# Patient Record
Sex: Female | Born: 1980 | Race: Black or African American | Hispanic: No | Marital: Single
Health system: Southern US, Community
[De-identification: ages and names within clinical notes are randomized; demographics above are authoritative.]

---

## 2010-11-16 ENCOUNTER — Emergency Department: Payer: Self-pay | Admitting: Emergency Medicine

## 2011-07-09 ENCOUNTER — Emergency Department: Payer: Self-pay | Admitting: *Deleted

## 2011-07-09 LAB — COMPREHENSIVE METABOLIC PANEL
Anion Gap: 7 (ref 7–16)
BUN: 13 mg/dL (ref 7–18)
Bilirubin,Total: 0.7 mg/dL (ref 0.2–1.0)
Calcium, Total: 9.1 mg/dL (ref 8.5–10.1)
Chloride: 105 mmol/L (ref 98–107)
Co2: 25 mmol/L (ref 21–32)
Creatinine: 0.82 mg/dL (ref 0.60–1.30)
EGFR (African American): >60
EGFR (Non-African Amer.): >60
Potassium: 3.5 mmol/L (ref 3.5–5.1)
SGOT(AST): 23 U/L (ref 15–37)
SGPT (ALT): 14 U/L

## 2011-07-09 LAB — CBC
HCT: 36.3 % (ref 35.0–47.0)
HGB: 12.2 g/dL (ref 12.0–16.0)
MCH: 29.6 pg (ref 26.0–34.0)
MCHC: 33.6 g/dL (ref 32.0–36.0)
MCV: 88 fL (ref 80–100)
Platelet: 273 10*3/uL (ref 150–440)
RBC: 4.11 10*6/uL (ref 3.80–5.20)

## 2011-07-09 LAB — URINALYSIS, COMPLETE
Bacteria: NONE SEEN
Glucose,UR: NEGATIVE mg/dL (ref 0–75)
Ketone: NEGATIVE
Leukocyte Esterase: NEGATIVE
Ph: 5 (ref 4.5–8.0)
Protein: NEGATIVE
RBC,UR: 1 /HPF (ref 0–5)
WBC UR: <1 /HPF (ref 0–5)

## 2011-07-09 LAB — HCG, QUANTITATIVE, PREGNANCY: Beta Hcg, Quant.: 7690 m[IU]/mL — ABNORMAL HIGH

## 2011-07-16 ENCOUNTER — Emergency Department: Payer: Self-pay | Admitting: *Deleted

## 2011-07-16 LAB — URINALYSIS, COMPLETE
Bilirubin,UR: NEGATIVE
Blood: NEGATIVE
Glucose,UR: NEGATIVE mg/dL (ref 0–75)
Ph: 7 (ref 4.5–8.0)
RBC,UR: 2 /HPF (ref 0–5)
Specific Gravity: 1.024 (ref 1.003–1.030)
WBC UR: 2 /HPF (ref 0–5)

## 2011-07-16 LAB — HCG, QUANTITATIVE, PREGNANCY: Beta Hcg, Quant.: 32323 m[IU]/mL — ABNORMAL HIGH

## 2011-07-16 LAB — COMPREHENSIVE METABOLIC PANEL
Albumin: 3.8 g/dL (ref 3.4–5.0)
Anion Gap: 8 (ref 7–16)
BUN: 11 mg/dL (ref 7–18)
Chloride: 102 mmol/L (ref 98–107)
Co2: 27 mmol/L (ref 21–32)
EGFR (African American): >60
EGFR (Non-African Amer.): >60
Osmolality: 272 (ref 275–301)
Potassium: 3.7 mmol/L (ref 3.5–5.1)
SGOT(AST): 13 U/L — ABNORMAL LOW (ref 15–37)
SGPT (ALT): 13 U/L
Sodium: 137 mmol/L (ref 136–145)

## 2011-07-16 LAB — CBC
HGB: 11.7 g/dL — ABNORMAL LOW (ref 12.0–16.0)
MCH: 29.8 pg (ref 26.0–34.0)
MCHC: 33.8 g/dL (ref 32.0–36.0)
Platelet: 262 10*3/uL (ref 150–440)
RBC: 3.93 10*6/uL (ref 3.80–5.20)
RDW: 12.2 % (ref 11.5–14.5)

## 2011-07-16 LAB — WET PREP, GENITAL

## 2012-11-22 ENCOUNTER — Observation Stay: Payer: Self-pay | Admitting: Obstetrics and Gynecology

## 2012-11-22 LAB — CBC WITH DIFFERENTIAL/PLATELET
Basophil %: 0.4 %
HGB: 12.6 g/dL (ref 12.0–16.0)
Lymphocyte %: 41.5 %
MCH: 30.3 pg (ref 26.0–34.0)
Monocyte #: 0.5 x10 3/mm (ref 0.2–0.9)
Neutrophil #: 4 10*3/uL (ref 1.4–6.5)
Platelet: 282 10*3/uL (ref 150–440)
WBC: 7.9 10*3/uL (ref 3.6–11.0)

## 2012-11-22 LAB — COMPREHENSIVE METABOLIC PANEL
Alkaline Phosphatase: 47 U/L — ABNORMAL LOW (ref 50–136)
Anion Gap: 3 — ABNORMAL LOW (ref 7–16)
BUN: 12 mg/dL (ref 7–18)
Bilirubin,Total: 0.7 mg/dL (ref 0.2–1.0)
Calcium, Total: 9.1 mg/dL (ref 8.5–10.1)
Co2: 27 mmol/L (ref 21–32)
EGFR (African American): >60
Osmolality: 269 (ref 275–301)
Potassium: 3.8 mmol/L (ref 3.5–5.1)
SGOT(AST): 21 U/L (ref 15–37)
SGPT (ALT): 15 U/L (ref 12–78)
Sodium: 135 mmol/L — ABNORMAL LOW (ref 136–145)

## 2012-11-22 LAB — URINALYSIS, COMPLETE
Bacteria: NONE SEEN
Bilirubin,UR: NEGATIVE
Glucose,UR: NEGATIVE mg/dL (ref 0–75)
Ketone: NEGATIVE
Leukocyte Esterase: NEGATIVE
Nitrite: NEGATIVE
Ph: 6 (ref 4.5–8.0)
Protein: NEGATIVE
Specific Gravity: 1.023 (ref 1.003–1.030)
WBC UR: <1 /HPF (ref 0–5)

## 2012-11-22 LAB — HCG, QUANTITATIVE, PREGNANCY: Beta Hcg, Quant.: 5224 m[IU]/mL — ABNORMAL HIGH

## 2012-12-11 IMAGING — US US PELV - US TRANSVAGINAL
1 series · 17 of 25 positions shown · non-contrast
Comparison: none

REASON FOR EXAM: bilateral pelvic pain
COMMENTS:

PROCEDURE:     US  - US PELVIS EXAM W/TRANSVAGINAL  - November 17, 2010  [DATE]
RESULT:     Comparison: None
INDICATION: Bilateral pelvic pain
TECHNIQUE: Multiple transabdominal gray-scale images and endovaginal
gray-scale images with doppler images of the pelvis performed.

[Series 1: us pelv - us transvaginal · 17 of 86 slices shown]
[im 1/86]
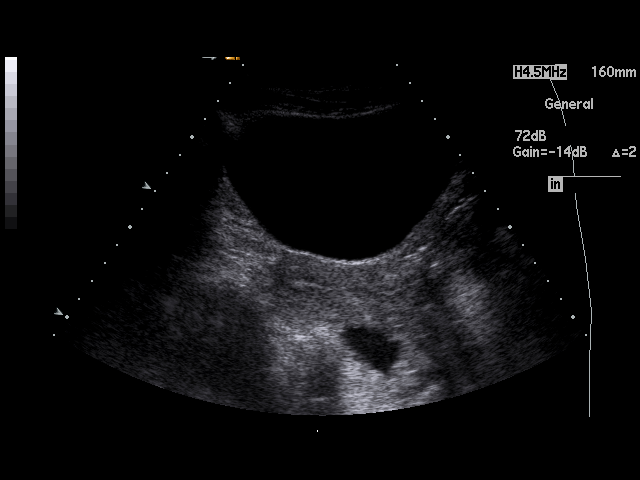
[im 8/86]
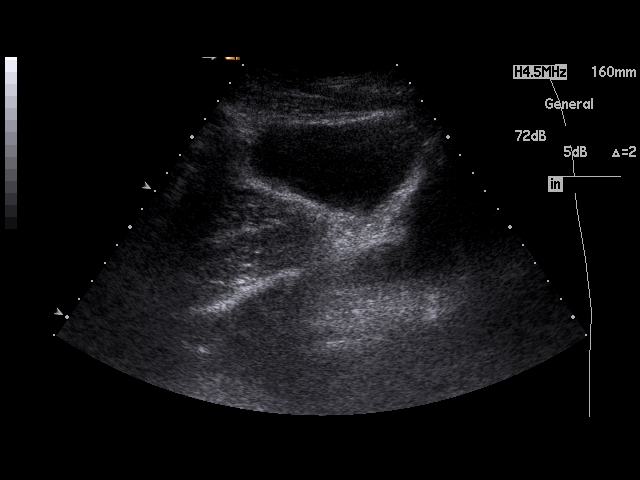
[im 11/86]
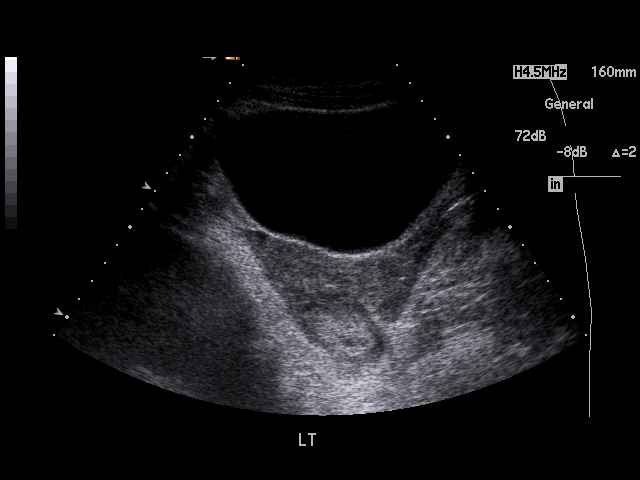
[im 18/86]
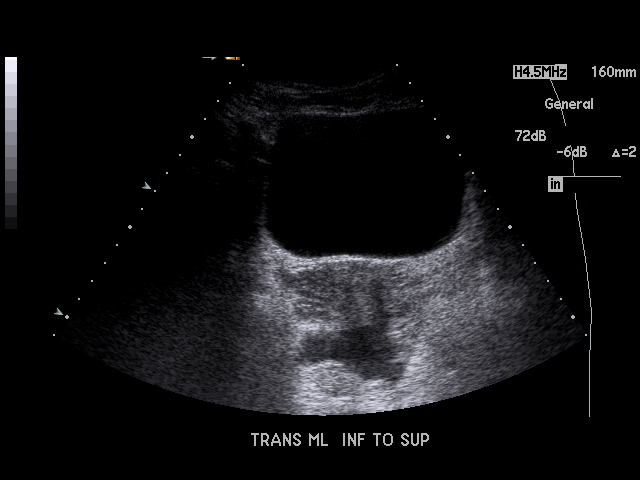
[im 22/86]
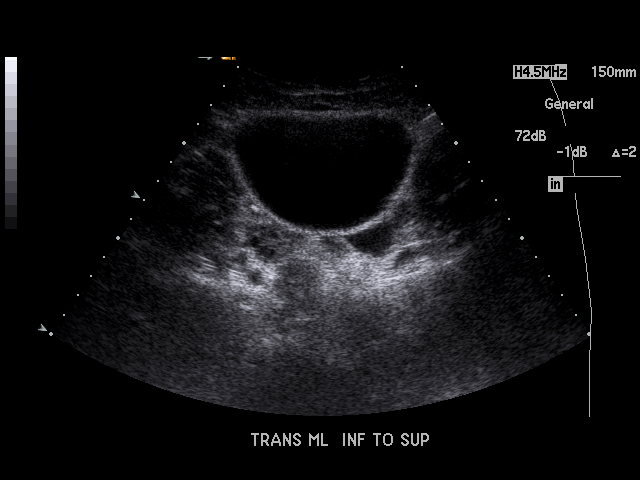
[im 29/86]
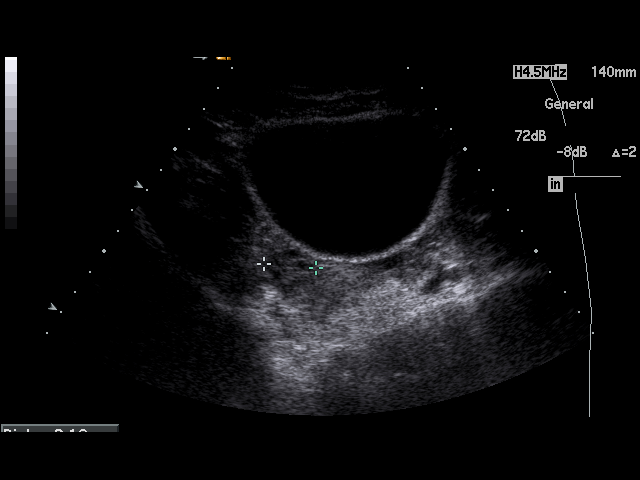
[im 32/86]
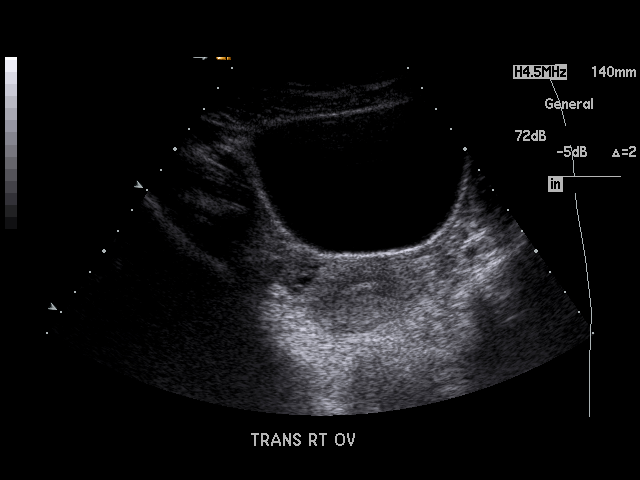
[im 39/86]
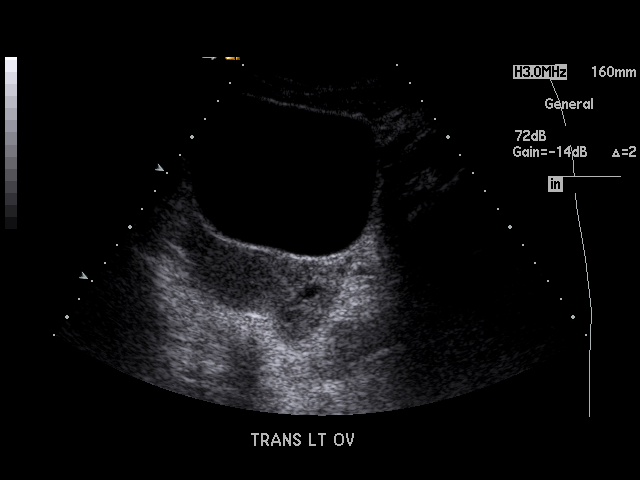
[im 43/86]
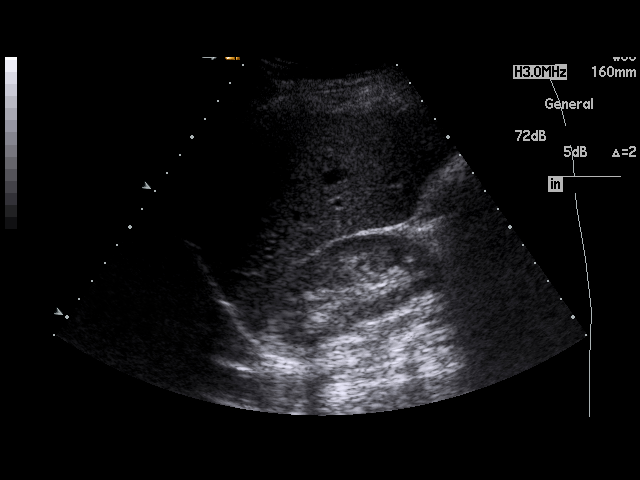
[im 47/86]
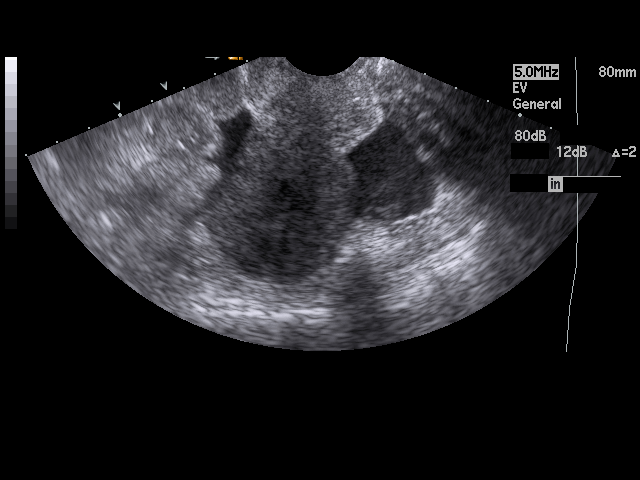
[im 54/86]
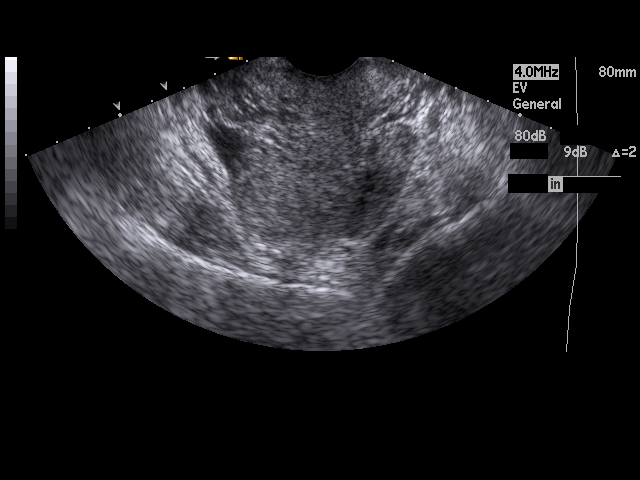
[im 57/86]
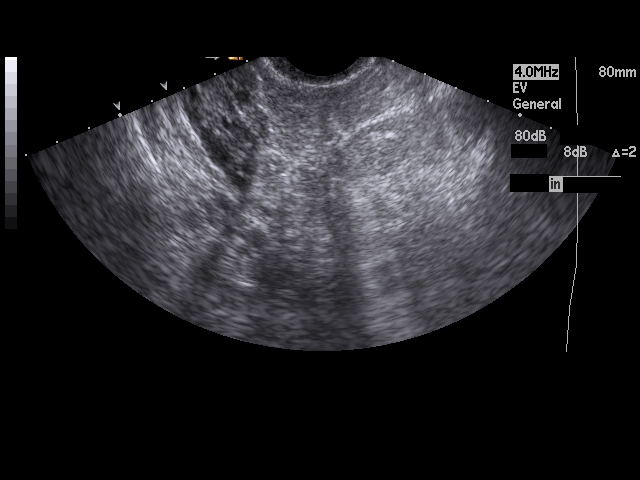
[im 64/86]
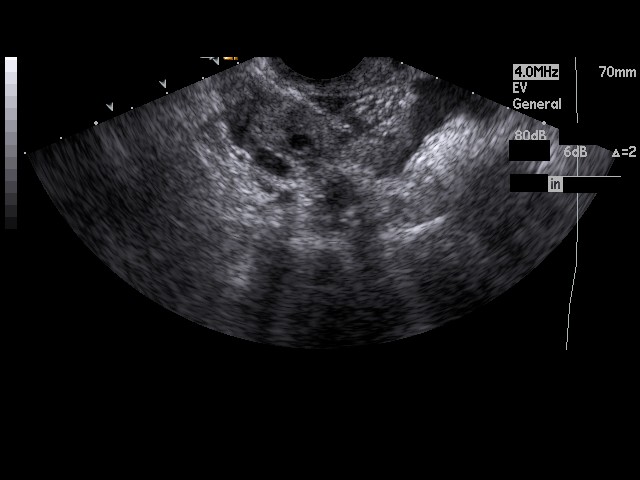
[im 68/86]
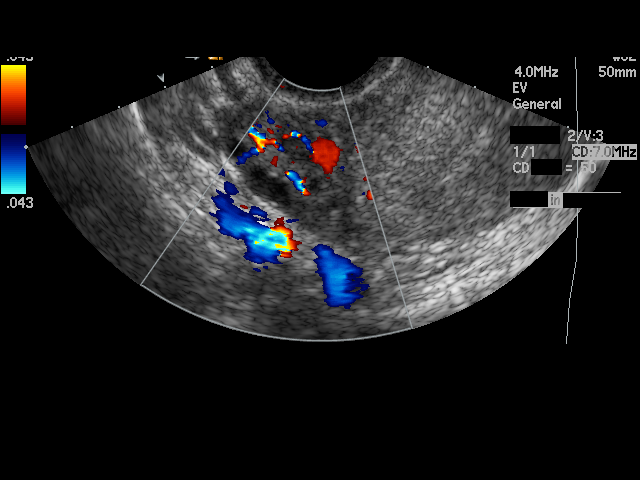
[im 75/86]
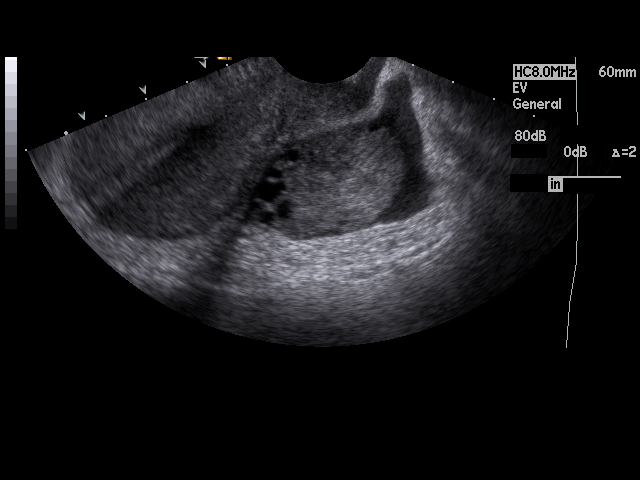
[im 78/86]
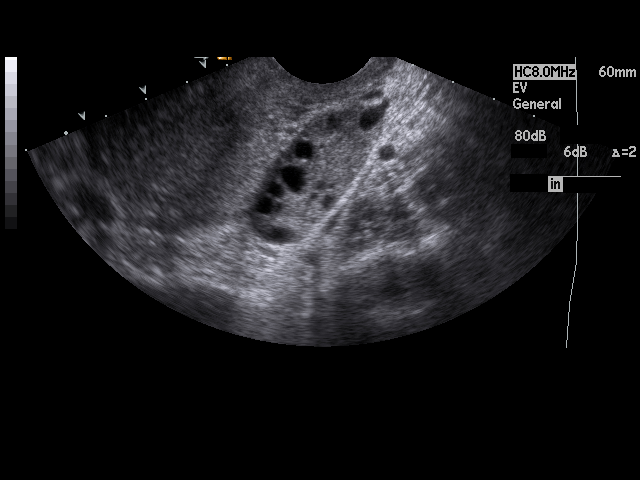
[im 86/86]
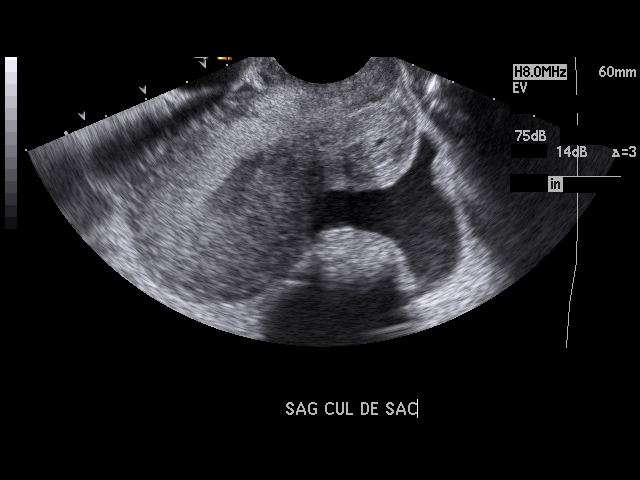

[17 of 25 positions shown; findings below may reference images not displayed]

FINDINGS: The uterus is normal in echotexture measuring 10.3 x 4.1 x 3.3 cm , with
transabdominal ultrasound. The endometrial stripe is uniform and homogeneous
measuring 3.6 mm.  There are no abnormal solid or cystic myometrial mass
lesions noted.

The right ovary measures 3.7 x 2.3 x 1.5 cm.  The left ovary measures 5.1 x
2.7 x 2.9 cm.  There is a heterogeneously hypoechoic 3.2 x 2.1 x 2.2 cm left
ovarian mass with no internal Doppler flow.  Normal arterial and venous
Doppler waveforms are demonstrated bilaterally.

There is a small-moderate amount of fluid in the cul-de-sac with the debris
within it.
IMPRESSION: 1. There is a heterogeneously hypoechoic 3.2 x 2.1 x 2.2 cm left ovarian
mass with no internal Doppler flow.  This may represent a hemorrhagic cyst
versus endometrioma. Recommend followup pelvic ultrasound in 4-6 weeks.

2. Nonspecific small-moderate amount of fluid in the cul-de-sac with the
debris within it.

## 2013-08-09 IMAGING — US US OB < 14 WEEKS - US OB TV
1 series · 14 of 28 positions shown · non-contrast
Comparison: none

REASON FOR EXAM: left pelvic pain with early gestation.  History of
rupture ovaian cyst
COMMENTS:   LMP: > one month ago

[Series 1: us ob < 14 weeks - us ob tv · 0.26mm/px · 82 acquisitions, 14 frames shown]
[im 4/82]
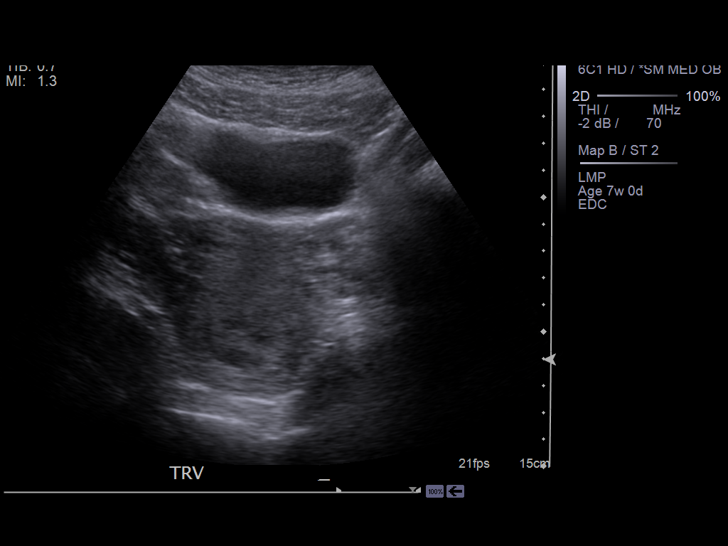
[im 10/82]
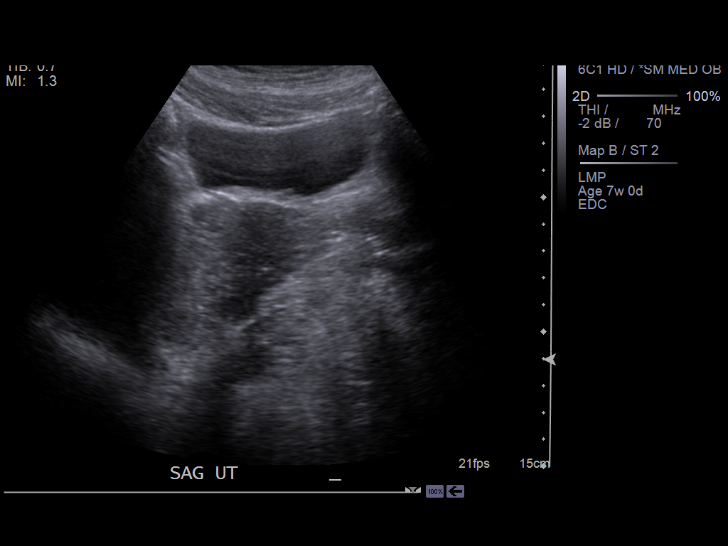
[im 16/82]
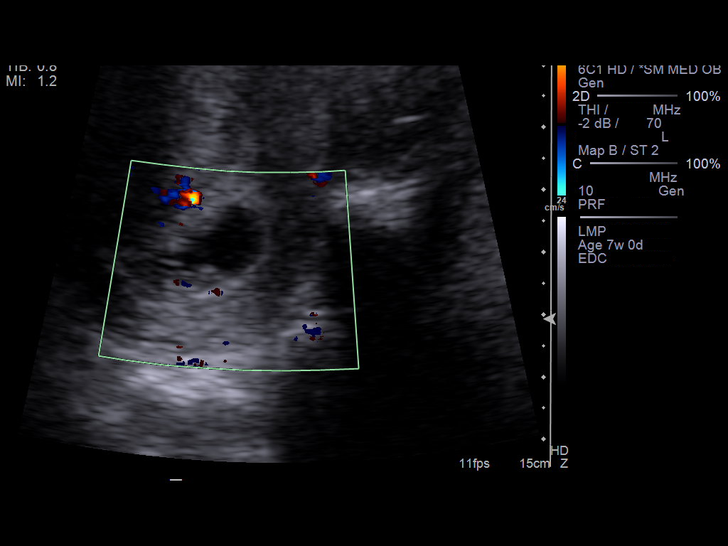
[im 22/82]
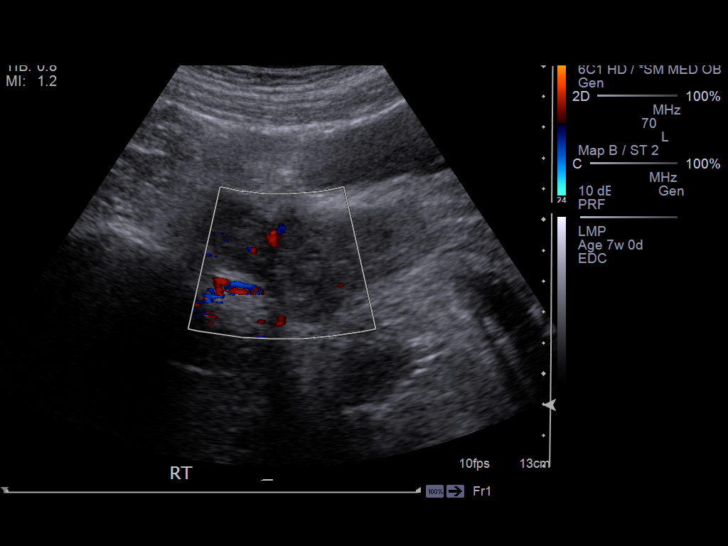
[im 28/82]
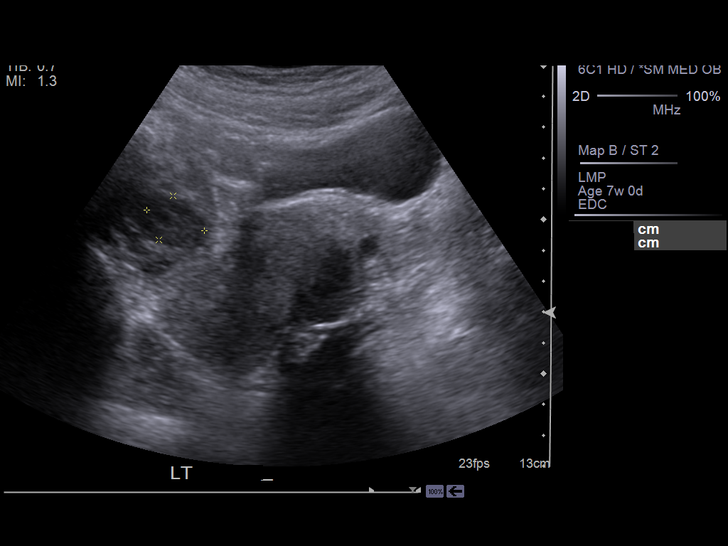
[im 34/82]
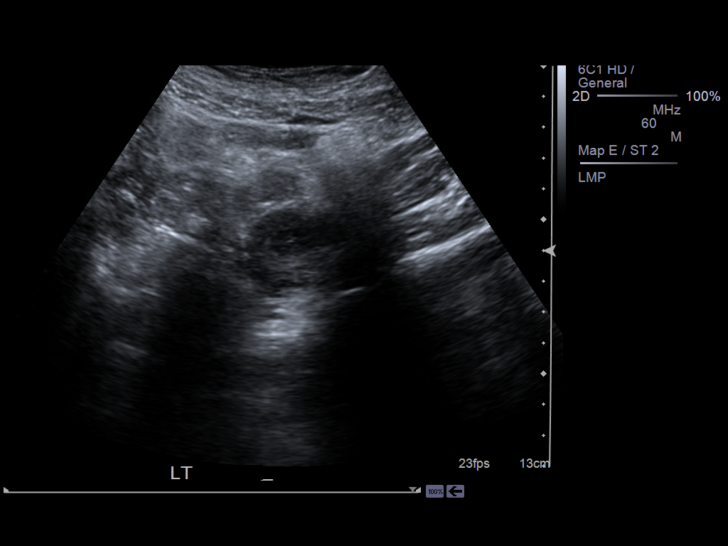
[im 40/82]
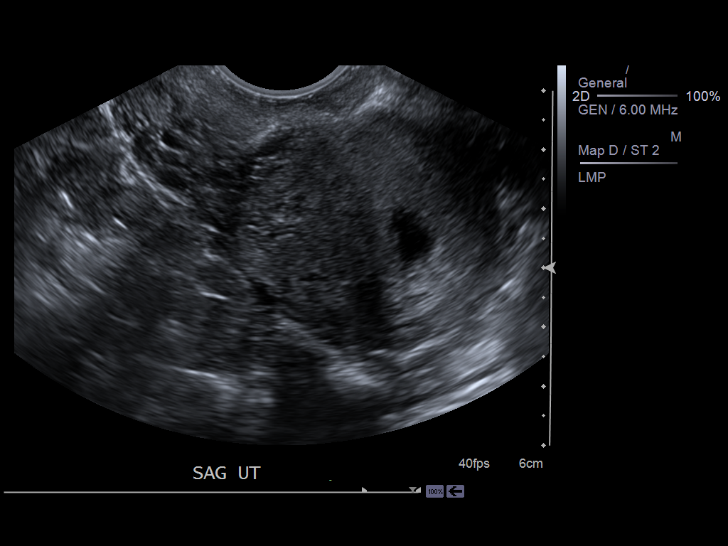
[im 46/82]
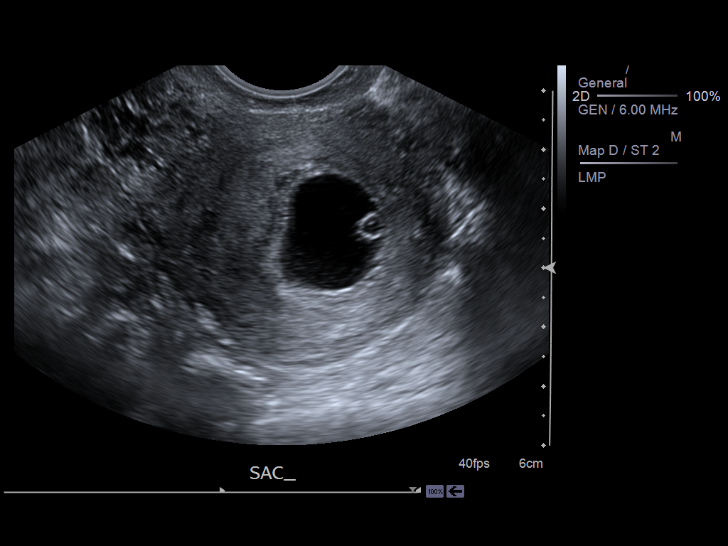
[im 52/82]
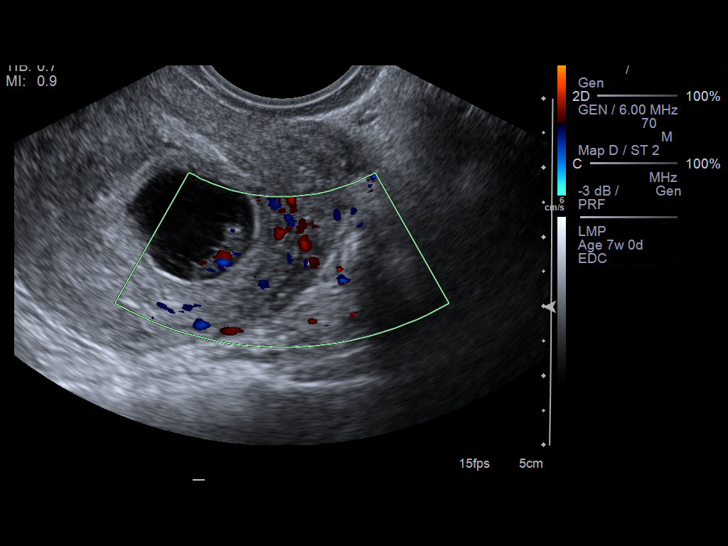
[im 58/82]
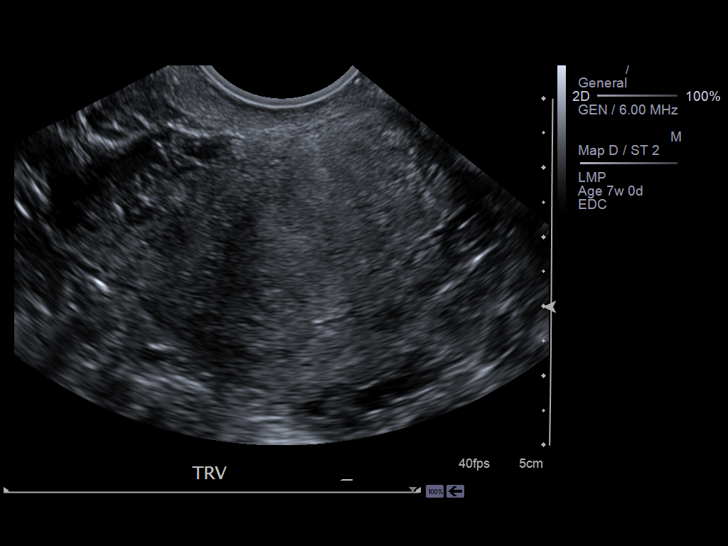
[im 64/82]
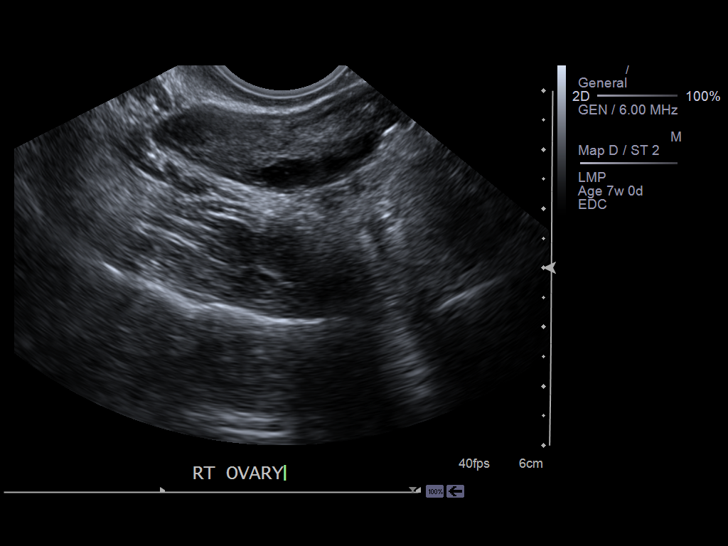
[im 70/82]
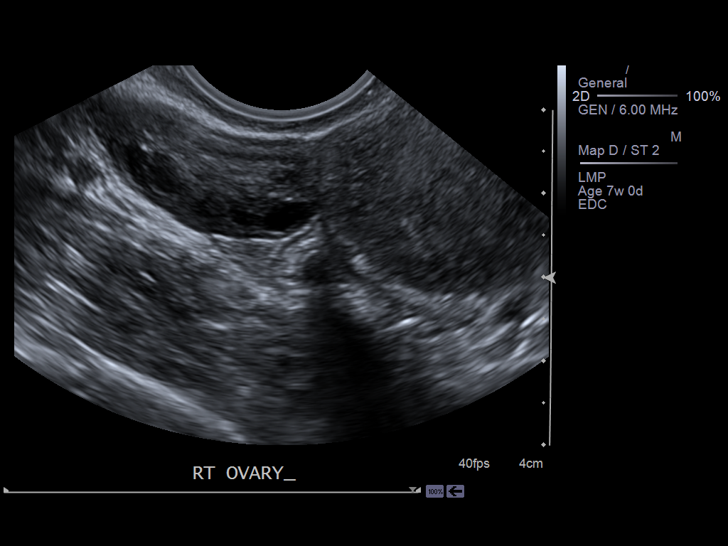
[im 76/82]
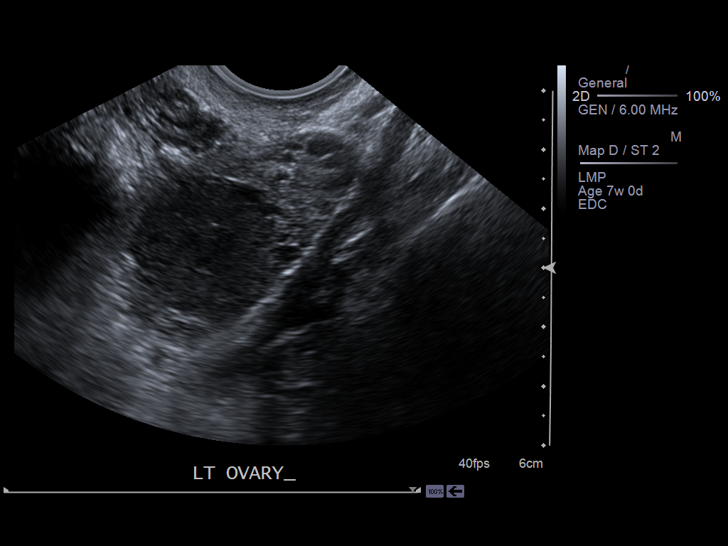
[im 82/82]
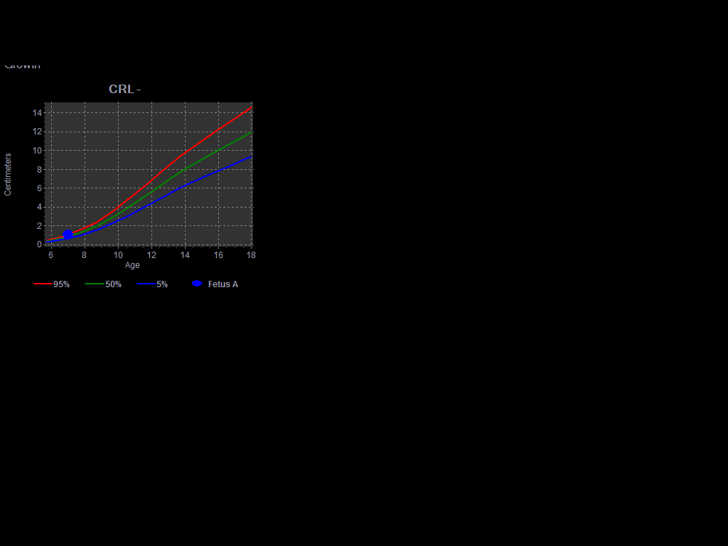

[14 of 28 positions shown; findings below may reference images not displayed]

PROCEDURE:     US  - US OB LESS THAN 14 WEEKS/W TRANS  - July 16, 2011  [DATE]

RESULT:     There is a gravid uterus present. A fetal pole and yolk sac are
demonstrated. A fetal cardiac rate of 118 beats per minute was demonstrated.
The maternal right ovary measures 3.3 x 1.8 cm in dimension and is normal in
echotexture. The left ovary measures 3.5 by 2 cm and contains a hypoechoic
structure measuring 2.5 cm in diameter.
IMPRESSION: 1. There is a viable IUP with a crown-rump length of 1.03 cm corresponding
to 7 week 1 day gestation. The estimated date of confinement is 02 March, 2012. This is in reasonable agreement with clinical dating. There is no
evidence of subchorionic hemorrhage.
2. On the left there is a hypoechoic focus in the ovary which may reflect a
hemorrhagic cyst or a corpus luteum cyst. The appearance does not suggest
that of an ectopic pregnancy. Serial followup beta-hCG determinations and
ultrasound exams would be useful especially if the patient's symptoms
persi[REDACTED]

## 2014-01-08 ENCOUNTER — Observation Stay: Payer: Self-pay | Admitting: Obstetrics & Gynecology

## 2014-01-08 LAB — URINALYSIS, COMPLETE
BILIRUBIN, UR: NEGATIVE
Bacteria: NONE SEEN
Blood: NEGATIVE
GLUCOSE, UR: NEGATIVE mg/dL (ref 0–75)
KETONE: NEGATIVE
Leukocyte Esterase: NEGATIVE
Nitrite: NEGATIVE
PH: 6 (ref 4.5–8.0)
Protein: 30
Specific Gravity: 1.018 (ref 1.003–1.030)

## 2014-04-28 NOTE — Op Note (Signed)
PATIENT NAME:  Debra Forbes, Debra Forbes MR#:  [id number] DATE OF BIRTH:  1980/04/28  DATE OF PROCEDURE:  11/29/2012  PREOPERATIVE DIAGNOSIS: Possible right ectopic pregnancy.   POSTOPERATIVE DIAGNOSIS: Right ruptured cornual ectopic.   PROCEDURE: Removal of right ectopic pregnancy, repair of uterine wall.   SURGEON: Elliot Gurneyarrie C Bryce Kimble, M.D.   ESTIMATED BLOOD LOSS: Approximately 200 mL on top of what was already in the belly of dark extravasated blood.   FINDINGS: Blown-out right cornua of the uterus with bleeding from small blood vessels. Normal ovaries bilaterally. Normal left tube. Normal-appearing uterus.   DESCRIPTION OF PROCEDURE: The patient was taken to the operating room and placed in supine position. After adequate general endotracheal anesthesia was instilled, the patient was prepped and draped in the usual sterile fashion. A side-opening speculum was placed in the patient's vagina, and the anterior lip of the cervix was grasped with a single-tooth tenaculum. The side-opening speculum was removed.   Attention was then turned to the umbilicus, which was injected with Marcaine. An incision was made infraumbilically. The Veress needle was placed. Hang drop test, fluid instillation test, and fluid aspiration test showed proper placement of the Veress needle. CO2 was turned on, and when CO2 was turned on, tympany was heard around the liver. CO2 was then placed on high flow. The Veress needle was removed. The Xcel trocar was then placed under direct visualization, and the patient was placed in Trendelenburg. Two other ports, an 11 placed on the left and a 5 placed on the right.   The pelvis was irrigated with copious amounts of warm normal saline, and the aforementioned findings were seen. Photographs were taken. The products of conception, placenta and the fetus, seen at the right cornual section, were removed, cut off with the Harmonic scalpel. The blood vessels that were bleeding were cauterized with  Kleppinger, and 2 figure-of-eight sutures were placed with the Endo Stitch of Vicryl suture. These were tightened down to tamponade the bleeding and to approximate the edges for healing.   The pelvis was then again copiously cleared with warm normal saline. The patient's trocars were removed. CO2 was allowed to escape. The incisions were closed with a UR-6 for the deep large incision, 4-0 Monocryl at the surface covered with Dermabond. Post surgery, the patient was given methotrexate to be sure that the entirety of the placenta had been eliminated from the cornual area. The tubal opening into the uterus was round and patent, and able to be probed.  The patient remained hemostatic and stable throughout the whole procedure. The patient had had a right salpingectomy for another previous ectopic. There was no scarring. There was no tissue left. The patient and her partner were told that she should not get pregnant again, as it would be very dangerous for her with risk of rupture of the cornual area, and that if she ever considered that she would need to [facility] a [facility] to get input as to the  safety and the ability to not only get pregnant but to carry a full-term pregnancy without problems.     ____________________________ Elliot Gurneyarrie C. Debra Nordmann, MD cck:cg D: 11/29/2012 00:25:00 ET T: 11/29/2012 05:09:08 ET JOB#: [id number]  cc: Elliot Gurneyarrie C. Hector Taft, MD, <Dictator>

## 2014-04-28 NOTE — Op Note (Signed)
PATIENT NAME:  Debra Forbes, Debra Forbes MR#:  [id number] DATE OF BIRTH:  06-Feb-1980  DATE OF PROCEDURE:  11/23/2012  PREOPERATIVE DIAGNOSIS: Possible right ectopic pregnancy.   POSTOPERATIVE DIAGNOSIS: Right ruptured cornual ectopic.   PROCEDURE: Removal of right ectopic pregnancy, repair of uterine wall.   SURGEON: Elliot Gurneyarrie C Deunte Bledsoe, M.D.   ESTIMATED BLOOD LOSS: Approximately 200 mL on top of what was already in the belly of dark extravasated blood.   FINDINGS: Blown-out right cornua of the uterus with bleeding from small blood vessels. Normal ovaries bilaterally. Normal left tube. Normal-appearing uterus.   DESCRIPTION OF PROCEDURE: The patient was taken to the operating room and placed in supine position. After adequate general endotracheal anesthesia was instilled, the patient was prepped and draped in the usual sterile fashion. A side-opening speculum was placed in the patient's vagina, and the anterior lip of the cervix was grasped with a single-tooth tenaculum. The side-opening speculum was removed.   Attention was then turned to the umbilicus, which was injected with Marcaine. An incision was made infraumbilically. The Veress needle was placed. Hang drop test, fluid instillation test, and fluid aspiration test showed proper placement of the Veress needle. CO2 was turned on, and when CO2 was turned on, tympany was heard around the liver. CO2 was then placed on high flow. The Veress needle was removed. The Xcel trocar was then placed under direct visualization, and the patient was placed in Trendelenburg. Two other ports, an 11 placed on the left and a 5 placed on the right.   The pelvis was irrigated with copious amounts of warm normal saline, and the aforementioned findings were seen. Photographs were taken. The products of conception, placenta and the fetus, seen at the right cornual section, were removed, cut off with the Harmonic scalpel. The blood vessels that were bleeding were cauterized with  Kleppinger, and 2 figure-of-eight sutures were placed with the Endo Stitch of Vicryl suture. These were tightened down to tamponade the bleeding and to approximate the edges for healing.   The pelvis was then again copiously cleared with warm normal saline. The patient's trocars were removed. CO2 was allowed to escape. The incisions were closed with a UR-6 for the deep large incision, 4-0 Monocryl at the surface covered with Dermabond. Post surgery, the patient was given methotrexate to be sure that the entirety of the placenta had been eliminated from the cornual area. The tubal opening into the uterus was round and patent, and able to be probed.  The patient remained hemostatic and stable throughout the whole procedure. The patient had had a right salpingectomy for another previous ectopic. There was no scarring. There was no tissue left. The patient and her partner were told that she should not get pregnant again, as it would be very dangerous for her with risk of rupture of the cornual area, and that if she ever considered that she would need to see a [facility] to get input as to the  safety and the ability to not only get pregnant but to carry a full-term pregnancy without problems.     ____________________________ Elliot Gurneyarrie C. Habiba Treloar, MD cck:cg D: 11/29/2012 00:25:00 ET T: 11/29/2012 05:09:08 ET JOB#: [id number]  cc: Elliot Gurneyarrie C. Nyja Westbrook, MD, <Dictator> Elliot GurneyARRIE C Lorris Carducci MD ELECTRONICALLY SIGNED 12/14/2012 20:40

## 2014-04-28 NOTE — H&P (Signed)
   Subjective/Chief Complaint pt waw feeling pressure in teh pelvis and she has a pos preg test at home.   History of Present Illness pt had LMP 765w6days ago and had a pos preg test at home. she was feeling pressure and she had an ectopic wit her last pregnancy and she was concerned. she comes in no pain on side more than the other. only at the bottom of herpelvis. no bl;eeding   Past History pt is a [id number] 1 ectopic, 2 Sab, 3 EAB, and one SVD,   Past Med/Surgical Hx:  Pregnancy:   Ovarian Cyst:   ALLERGIES:  No Known Allergies:    Other Allergies NONE   Family and Social History:  Family History Non-Contributory   Social History negative tobacco   Place of Living Home   Review of Systems:  Subjective/Chief Complaint ECTOIC PREGNANCY   Fever/Chills No   Cough No   Sputum No   Abdominal Pain No   Diarrhea No   Constipation No   Nausea/Vomiting No   SOB/DOE No   Chest Pain No   Dysuria No   Tolerating Diet Yes   Medications/Allergies Reviewed Medications/Allergies reviewed   Physical Exam:  GEN well developed, well nourished   HEENT PERRL, hearing intact to voice   NECK supple  No masses  thyroid not tender   RESP normal resp effort   CARD regular rate   ABD positive tenderness  soft  hypoactive BS   LYMPH negative neck   EXTR negative cyanosis/clubbing   SKIN normal to palpation   PSYCH alert   Additional Comments will take to or and do salpingostomy and try to save left tube if possible as she says she does not have a right tube from previous ectopic    Assessment/Admission Diagnosis ectopic unknown tube   Plan dx l/s and removal of ectopic   Electronic Signatures: Adria DevonKlett, Rj Pedrosa (MD)  (Signed 17-Nov-14 17:04)  Authored: CHIEF COMPLAINT and HISTORY, PAST MEDICAL/SURGIAL HISTORY, ALLERGIES, Other Allergies, FAMILY AND SOCIAL HISTORY, REVIEW OF SYSTEMS, PHYSICAL EXAM, ASSESSMENT AND PLAN   Last Updated: 17-Nov-14 17:04 by Adria DevonKlett, Caymen Dubray  (MD)

## 2014-05-16 NOTE — H&P (Signed)
L&D Evaluation:  History:  HPI 34yo [id number] at 31.0 based on LMP c/w 1st trimester US with EDC of 03/12/14.  [facility] is a [facility] due to a history of a right cornual ruptured ectopic.  She presents today with contractions, every 10 minutes, getting stronger.  no LOF VB +FM  Pregnancy issues: 1. high risk after cornual rupture and advised never to get pregnant again; pregnancy unintended 2. O+ / Ab neg / RI / RPR NR/ HIV NR/ GCCT neg / 1hr 128 / HbsAg neg /hx of chicken pox 3. 3x TAB, 2 Ectopic (1= R salpingectomy 2= R cornual rupture) 4. Due to history of cornual rupture and repair, patient would need primary cesarean (as though classical), scheduled at 36-37 weeks. also plans TL. 5. h/o PID - treated per pt   Presents with contractions   Patient's Medical History anemia, headache    Patient's Surgical History Abdominal Surgery  R salpingectomy, R cornual repair    Medications Pre Natal Vitamins    Allergies NKDA   Social History none    Family History Non-Contributory    ROS:  ROS All systems were reviewed.  HEENT, CNS, GI, GU, Respiratory, CV, Renal and Musculoskeletal systems were found to be normal.   Exam:  Vital Signs stable    Urine Protein not completed   General no apparent distress   Mental Status clear    Chest clear    Heart normal sinus rhythm   Abdomen gravid, non-tender   Estimated Fetal Weight Average for gestational age   Pelvic cervix closed and thick   Mebranes Intact   FHT normal rate with no decels, 140 mod +accels no decels   Fetal Heart Rate 140    Ucx irregular, q10-15 min and spacing out   Impression:  Impression preterm contractions   Plan:  Comments 34yo [id number] with occasional contractions, no cervical change  1. recommend hydration 2. patient has appointment in 3 days, keep this appointment. 3. not an FFN candidate due to nursing cervical exam. preterm labor precautions reviewed, encouraged patient that she  did the right thing coming in for evaluation, and that if her contractions return with regularity, she may be a candidate for steroid injection for fetal lung maturity.   Follow Up Appointment already scheduled   Electronic Signatures: Andrell Bergeson, Elenora Fenderhelsea C (MD)  (Signed 03-Jan-16 08:00)  Authored: L&D Evaluation   Last Updated: 03-Jan-16 08:00 by Arrington Yohe, Elenora Fenderhelsea C (MD)
